# Patient Record
Sex: Female | Born: 2000 | Race: Black or African American | Hispanic: No | Marital: Single | Smoking: Never smoker
Health system: Southern US, Community
[De-identification: ages and names within clinical notes are randomized; demographics above are authoritative.]

## PROBLEM LIST (undated history)

## (undated) DIAGNOSIS — Q892 Congenital malformations of other endocrine glands: Secondary | ICD-10-CM

## (undated) HISTORY — PX: NO PAST SURGERIES: SHX2092

## (undated) HISTORY — DX: Congenital malformations of other endocrine glands: Q89.2

---
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
## 2022-09-01 ENCOUNTER — Telehealth: Payer: Self-pay

## 2022-09-01 NOTE — Telephone Encounter (Signed)
Pt called triage reporting abnormal bleeding with her nexplanon, this is her 2nd nexplanon she's had this one for 2 years. Pt states she's been bleeding for 3 weeks, not heavy. She's aware she needs to schedule an annual and can discuss this issue then. Transferred to front to schedule.

## 2022-09-11 ENCOUNTER — Encounter: Payer: Self-pay | Admitting: Obstetrics

## 2022-09-11 ENCOUNTER — Ambulatory Visit (INDEPENDENT_AMBULATORY_CARE_PROVIDER_SITE_OTHER): Payer: Medicaid Other | Admitting: Obstetrics

## 2022-09-11 ENCOUNTER — Other Ambulatory Visit (HOSPITAL_COMMUNITY)
Admission: RE | Admit: 2022-09-11 | Discharge: 2022-09-11 | Disposition: A | Discharge status: Home / Self Care | Payer: Medicaid Other | Source: Ambulatory Visit | Attending: Obstetrics | Admitting: Obstetrics

## 2022-09-11 VITALS — BP 111/77 | HR 83 | Ht 66.0 in | Wt 156.0 lb

## 2022-09-11 DIAGNOSIS — Z01419 Encounter for gynecological examination (general) (routine) without abnormal findings: Secondary | ICD-10-CM

## 2022-09-11 DIAGNOSIS — Z113 Encounter for screening for infections with a predominantly sexual mode of transmission: Secondary | ICD-10-CM | POA: Diagnosis not present

## 2022-09-11 NOTE — Progress Notes (Signed)
ANNUAL GYNECOLOGICAL EXAM  SUBJECTIVE  HPI  Suzanne Mcconnell is a 22 y.o.-year-old [id number] who presents for an annual gynecological exam today.  She denies pelvic pain, dyspareunia, abnormal vaginal bleeding or discharge, and UTI symptoms. She is on her second Nexplanon, which was placed 07/2021. She has been happy with it for the most part and has regular monthly period. However, she recently had daily spotting for about 3 weeks. She is sexually active with one female partner.  Medical/Surgical History Past Medical History:  Diagnosis Date   Thyroglossal duct cyst    Past Surgical History:  Procedure Laterality Date   EAR CYST EXCISION Right 12/21/2021   Procedure: EXCISION EAR KELOID WITH INTRAOPERATIVE STERIOD INJECTION;  Surgeon: Bud Face, MD;  Location: [facility];  Service: ENT;  Laterality: Right;  Kenalog injection   NO PAST SURGERIES      Social History Lives with mom. 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 regular periods every month Last Pap: will start at 23 Contraception: Nexplanon  Prevention Endorses regular dental and eye exams Mammogram: at 42 Colonoscopy: at 47 Flu shot/vaccines: unsure if she had Gardasil but will find out  Current Medications Outpatient Medications Prior to Visit  Medication Sig   etonogestrel (NEXPLANON) 68 MG IMPL implant 1 each by Subdermal route once.   ondansetron (ZOFRAN) 4 MG tablet Take 1 tablet (4 mg total) by mouth every 8 (eight) hours as needed for nausea or vomiting.   No facility-administered medications  prior to visit.      [facility]stream - 09/11/22 0841       Pregnancy Intention Screening   Does the patient want to become pregnant in the next year? No    Does the patient's partner want to become pregnant in the next year? No    Would the patient like to discuss contraceptive options today? No      Contraception Wrap Up   Current Method Hormonal Implant    Contraception Counseling Provided No    How was the end contraceptive method provided? N/A            The pregnancy intention screening data noted above was reviewed. Potential methods of contraception were discussed. The patient elected to continue Nexplanon.  ROS Constitutional: Denied constitutional symptoms, night sweats, recent illness, fatigue, fever, insomnia and weight loss.  Eyes: Denied eye symptoms, eye pain, photophobia, vision change and visual disturbance.  Ears/Nose/Throat/Neck: Denied ear, nose, throat or neck symptoms, hearing loss, nasal discharge, sinus congestion and sore throat.  Cardiovascular: Denied cardiovascular symptoms, arrhythmia, chest pain/pressure, edema, exercise intolerance, orthopnea and palpitations.  Respiratory: Denied pulmonary symptoms, asthma, pleuritic pain, productive sputum, cough, dyspnea and wheezing.  Gastrointestinal: Denied, gastro-esophageal reflux, melena, nausea and vomiting.  Genitourinary: Denied genitourinary symptoms including symptomatic vaginal discharge, pelvic relaxation issues, and urinary complaints.  Musculoskeletal: Denied musculoskeletal symptoms, stiffness, swelling, muscle weakness and myalgia.  Dermatologic: Denied  dermatology symptoms, rash and scar.  Neurologic: Denied neurology symptoms, dizziness, headache, neck pain and syncope.  Psychiatric: Denied psychiatric symptoms, anxiety and depression.  Endocrine: +hot flashes    OBJECTIVE  BP 111/77   Pulse 83   Ht 5\' 6"  (1.676 m)   Wt 156 lb (70.8 kg)   LMP 08/19/2022   BMI 25.18 kg/m    Physical  examination General NAD, Conversant  HEENT Atraumatic; Op clear with mmm.  Normo-cephalic. Pupils reactive. Anicteric sclerae  Thyroid/Neck Smooth without nodularity or enlargement. Normal ROM.  Neck Supple.  Skin No rashes, lesions or ulceration. Normal palpated skin turgor. No nodularity.  Breasts: No masses or discharge.  Symmetric.  No axillary adenopathy.  Lungs: Clear to auscultation.No rales or wheezes. Normal Respiratory effort, no retractions.  Heart: NSR.  No murmurs or rubs appreciated. No peripheral edema  Abdomen: Soft.  Non-tender.  No masses.  No HSM. No hernia  Extremities: Moves all appropriately.  Normal ROM for age. No lymphadenopathy.  Neuro: Oriented to PPT.  Normal mood. Normal affect.     Pelvic: deferred    ASSESSMENT  1) Annual exam 2) Abnormal bleeding with Nexplanon in place  PLAN 1) Physical exam as noted. Discussed healthy lifestyle choices and preventive care. STI testing done. Encouraged Gardasil series if she has not completed this previously. 2) Discussed possible causes of spotting, including infection or normal variation of Nexplanon. Since this is the first instance of abnormal bleeding, Arelly will continue to monitor her bleeding for now and will consider an alternate form of contraception if it worsens or is unacceptable.  Return in one year for annual exam or as needed for concerns.   Guadlupe Spanish, CNM

## 2022-09-12 LAB — HEPATITIS B SURFACE ANTIBODY,QUALITATIVE: Hep B Surface Ab, Qual: NONREACTIVE

## 2022-09-12 LAB — HEMOGLOBIN A1C
Est. average glucose Bld gHb Est-mCnc: 114 mg/dL
Hgb A1c MFr Bld: 5.6 % (ref 4.8–5.6)

## 2022-09-12 LAB — CBC
Hematocrit: 39.2 % (ref 34.0–46.6)
Hemoglobin: 12.2 g/dL (ref 11.1–15.9)
MCH: 28.8 pg (ref 26.6–33.0)
MCHC: 31.1 g/dL — ABNORMAL LOW (ref 31.5–35.7)
MCV: 93 fL (ref 79–97)
Platelets: 205 10*3/uL (ref 150–450)
RBC: 4.24 x10E6/uL (ref 3.77–5.28)
RDW: 13.4 % (ref 11.7–15.4)
WBC: 3.5 10*3/uL (ref 3.4–10.8)

## 2022-09-12 LAB — HEPATITIS B SURFACE ANTIGEN: Hepatitis B Surface Ag: NEGATIVE

## 2022-09-12 LAB — HEPATITIS C ANTIBODY: Hep C Virus Ab: NONREACTIVE

## 2022-09-12 LAB — RPR: RPR Ser Ql: NONREACTIVE

## 2022-09-12 LAB — HIV ANTIBODY (ROUTINE TESTING W REFLEX): HIV Screen 4th Generation wRfx: NONREACTIVE

## 2022-09-13 LAB — CERVICOVAGINAL ANCILLARY ONLY
Bacterial Vaginitis (gardnerella): NEGATIVE
Candida Glabrata: NEGATIVE
Candida Vaginitis: NEGATIVE
Chlamydia: NEGATIVE
Comment: NEGATIVE
Comment: NEGATIVE
Comment: NEGATIVE
Comment: NEGATIVE
Comment: NEGATIVE
Comment: NORMAL
Neisseria Gonorrhea: NEGATIVE
Trichomonas: NEGATIVE

## 2022-09-14 ENCOUNTER — Encounter: Payer: Self-pay | Admitting: Obstetrics

## 2023-08-18 ENCOUNTER — Emergency Department

## 2023-08-18 ENCOUNTER — Emergency Department
Admission: EM | Admit: 2023-08-18 | Discharge: 2023-08-18 | Disposition: A | Discharge status: Home / Self Care | Attending: Emergency Medicine | Admitting: Emergency Medicine

## 2023-08-18 ENCOUNTER — Other Ambulatory Visit: Payer: Self-pay

## 2023-08-18 DIAGNOSIS — W231XXA Caught, crushed, jammed, or pinched between stationary objects, initial encounter: Secondary | ICD-10-CM | POA: Insufficient documentation

## 2023-08-18 DIAGNOSIS — S61300A Unspecified open wound of right index finger with damage to nail, initial encounter: Secondary | ICD-10-CM | POA: Insufficient documentation

## 2023-08-18 DIAGNOSIS — S61309A Unspecified open wound of unspecified finger with damage to nail, initial encounter: Secondary | ICD-10-CM

## 2023-08-18 MED ORDER — LIDOCAINE HCL (PF) 1 % IJ SOLN
10.0000 mL | Freq: Once | INTRAMUSCULAR | Status: DC
  Filled 2023-08-18: qty 10

## 2023-08-18 NOTE — Discharge Instructions (Addendum)
 There is a chance of the nail will stay in place and continue to grow like normal but there is also a chance that the nail will fall out and that a new nail will grow in its place, which will take many months.  Please keep your wound clean by washing at least daily with soap and water. If you see any signs of infection like spreading redness, pus coming from the wound, extreme pain, fevers, chills or any other worsening doctor right away or come back to the emergency department   Take acetaminophen 650 mg and ibuprofen 400 mg every 6 hours for pain.  Take with food. You can use ice to help with pain and swelling as well.  Thank you for choosing us  for your health care today!  Please see your primary doctor this week for a follow up appointment.   If you have any new, worsening, or unexpected symptoms call your doctor right away or come back to the emergency department for reevaluation.  It was my pleasure to care for you today.   Ginnie EDISON Cyrena, MD

## 2023-08-18 NOTE — ED Triage Notes (Signed)
 Pt here after shutting her right index finger in the door. Pt's nail is lifted.

## 2023-08-18 NOTE — ED Provider Notes (Signed)
 [facility] Provider Note    Event Date/Time   First MD Initiated Contact with Patient 08/18/23 0600     (approximate)   History   Finger Injury   HPI  Suzanne Mcconnell is a 23 y.o. female   Past medical history of no significant past medical history here with a finger injury and partial nail avulsion to the right index finger.  She slammed her finger in a door.  She has acrylic nails.  It is partially avulsed.  No other injuries noted.    Physical Exam   Triage Vital Signs: ED Triage Vitals  Encounter Vitals Group     BP --      Girls Systolic BP Percentile --      Girls Diastolic BP Percentile --      Boys Systolic BP Percentile --      Boys Diastolic BP Percentile --      Pulse Rate 08/18/23 0600 77     Resp 08/18/23 0600 16     Temp 08/18/23 0600 98.2 F (36.8 C)     Temp Source 08/18/23 0600 Oral     SpO2 08/18/23 0600 100 %     Weight 08/18/23 0559 156 lb 1.4 oz (70.8 kg)     Height 08/18/23 0559 5' 6 (1.676 m)     Head Circumference --      Peak Flow --      Pain Score 08/18/23 0559 4     Pain Loc --      Pain Education --      Exclude from Growth Chart --     Most recent vital signs: Vitals:   08/18/23 0600  BP: 117/72  Pulse: 77  Resp: 16  Temp: 98.2 F (36.8 C)  SpO2: 100%    General: Awake, no distress.  CV:  Good peripheral perfusion.  Resp:  Normal effort.  Abd:  No distention.  Other:  Partially avulsed index finger right sided nail.  Visualized portions of the nailbed do not appear to have a laceration.   ED Results / Procedures / Treatments   Labs (all labs ordered are listed, but only abnormal results are displayed) Labs Reviewed - No data to display    RADIOLOGY I independently reviewed and interpreted x-ray of the finger see no obvious fracture or dislocation I also reviewed radiologist's formal read.   PROCEDURES:  Critical Care performed: No  Procedures   MEDICATIONS ORDERED IN  ED: Medications  lidocaine  (PF) (XYLOCAINE ) 1 % injection 10 mL (has no administration in time range)     IMPRESSION / MDM / ASSESSMENT AND PLAN / ED COURSE  I reviewed the triage vital signs and the nursing notes.                                Patient's presentation is most consistent with acute complicated illness / injury requiring diagnostic workup.  Differential diagnosis includes, but is not limited to, nailbed laceration, fracture, and a partial nail avulsion    MDM: Finger injury with partial avulsion, x-ray to assess for fracture.  Clinical exam shows no nailbed laceration that'd would require repair.  Digital block to help facilitate replacement of the nail into the nail fold, which is pretty well-established in place already with just 1 edge that has partially avulsed, put back in place with minimal manipulation, glued in place as well, tolerated well and will discharge  with follow-up with PMD.      FINAL CLINICAL IMPRESSION(S) / ED DIAGNOSES   Final diagnoses:  Nail avulsion, finger, initial encounter     Rx / DC Orders   ED Discharge Orders     None        Note:  This document was prepared using Dragon voice recognition software and may include unintentional dictation errors.    Cyrena Mylar, MD 08/18/23 [phone]

## 2023-11-26 ENCOUNTER — Ambulatory Visit

## 2023-11-26 DIAGNOSIS — Z113 Encounter for screening for infections with a predominantly sexual mode of transmission: Secondary | ICD-10-CM

## 2023-11-26 LAB — WET PREP FOR TRICH, YEAST, CLUE
Clue Cell Exam: NEGATIVE
Trichomonas Exam: NEGATIVE
Yeast Exam: NEGATIVE

## 2023-11-26 LAB — HM HIV SCREENING LAB: HM HIV Screening: NEGATIVE

## 2023-11-26 NOTE — Progress Notes (Signed)
 [facility] Department STI clinic [address], [address] [place] [address] Main phone: [phone]  STI screening visit  Subjective:  Suzanne Mcconnell is a 23 y.o. female being seen today for an STI screening visit. The patient reports they do have symptoms.  Patient reports that they do not desire a pregnancy in the next year. Patient is currently using hormonal implant to prevent pregnancy. They reported they are not interested in discussing contraception today.    Patient's last menstrual period was 11/19/2023 (approximate).  Patient has the following medical conditions:  There are no active problems to display for this patient.  Chief Complaint  Patient presents with   SEXUALLY TRANSMITTED DISEASE   HPI Patient reports frequent yeast and BV infections. She has a cyst in her neck that requires her to take amoxicillin every few weeks along with prednisone. Gets yeast infections when she takes her amoxicillin. Not sexually active frequently - last sex was 2 weeks ago. She is not frequently sexually active.   Does the patient using douching products? Yes: boric acid rinses  See flowsheet for further details and programmatic requirements Hyperlink available at the top of the signed note in blue.  Flow sheet content below:  Pregnancy Intention Screening Does the patient want to become pregnant in the next year?: No Does the patient's partner want to become pregnant in the next year?: No Would the patient like to discuss contraceptive options today?: No Counseling Patient counseled to use condoms with all sex: Condoms given RTC in 2-3 weeks for test results: Yes Clinic will call if test results abnormal before test result appt.: Yes Test results given to patient Patient counseled to use condoms with all sex: Condoms given   Screening for MPX risk:  Unexplained rash?  No   MSM?  No   Multiple or anonymous sex partners?  No   Any close or sexual  contact with a person  diagnosed with MPX?  No   Any outside the [place]  where MPX is endemic?  No   High clinical suspicion for MPX?    -Unlikely to be chickenpox    -Lymphadenopathy    -Rash that presents in same phase of       evolution on any given body part  No   Screenings: Last HIV test per patient/review of record was No results found for: HMHIVSCREEN  Lab Results  Component Value Date   HIV Non Reactive 09/11/2022   Last HEPC test per patient/review of record was No results found for: HMHEPCSCREEN No components found for: HEPC   Last HEPB test per patient/review of record was No components found for: HMHEPBSCREEN   Patient reports last pap was:   No results found for: SPECADGYN No Cervical Cancer Screening results to display.  Immunization history:   There is no immunization history on file for this patient.  The following portions of the patient's history were reviewed and updated as appropriate: allergies, current medications, past medical history, past social history, past surgical history and problem list.  Objective:  There were no vitals filed for this visit.  Physical Exam Constitutional:      Appearance: Normal appearance.  HENT:     Head: Normocephalic.     Mouth/Throat:     Mouth: Mucous membranes are moist.     Pharynx: Oropharynx is clear. No pharyngeal swelling, oropharyngeal exudate or posterior oropharyngeal erythema.  Eyes:     General: No scleral icterus.  Right eye: No discharge.        Left eye: No discharge.  Pulmonary:     Effort: Pulmonary effort is normal.  Lymphadenopathy:     Cervical: No cervical adenopathy.  Skin:    General: Skin is warm and dry.  Neurological:     Mental Status: She is alert.  Psychiatric:        Mood and Affect: Mood normal.        Behavior: Behavior normal.    Assessment and Plan:  Suzanne Mcconnell is a 23 y.o. female presenting to the [facility] Department for STI screening  1.  Screening for venereal disease (Primary)  - Chlamydia/Gonorrhea [place] [facility] - WET PREP FOR TRICH, YEAST, CLUE - HIV [place] [facility] - Syphilis Serology, [facility]   [place] criteria not met for BV today. Discussed w/ patient.  Patient accepted the following screenings: vaginal CT/GC swab, vaginal wet prep, HIV, and RPR Patient meets criteria for HepB screening? No. Ordered? no Patient meets criteria for HepC screening? No. Ordered? no  Treat wet prep per standing order Discussed time line for [facility] results and that patient will be called with positive results and encouraged patient to call if she had not heard in 2 weeks.  Counseled to return or seek care for continued or worsening symptoms Recommended repeat testing in 3 months with positive results. Recommended condom use with all sex for STI prevention.   Return if symptoms worsen or fail to improve.  No future appointments.  Damien FORBES Satchel, NP

## 2023-11-26 NOTE — Progress Notes (Signed)
 Wet prep results reviewed with pt, no treatment required per SO. Larraine JONELLE Northern, RN
